# Patient Record
Sex: Male | Born: 1990
Health system: Southern US, Community
[De-identification: ages and names within clinical notes are randomized; demographics above are authoritative.]

## PROBLEM LIST (undated history)

## (undated) HISTORY — PX: APPENDECTOMY: SHX54

---
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
## 2010-07-15 LAB — URINALYSIS, ROUTINE W REFLEX MICROSCOPIC
Glucose, UA: NEGATIVE mg/dL
Hgb urine dipstick: NEGATIVE
Specific Gravity, Urine: 1.017 (ref 1.005–1.030)
pH: 7 (ref 5.0–8.0)

## 2010-08-21 NOTE — H&P (Signed)
NAME:  PARSA, RICKETT                 ACCOUNT NO.:  [id number]   MEDICAL RECORD NO.:  [id number]          PATIENT TYPE:  OBV   LOCATION:  0111                         FACILITY:  [facility]   PHYSICIAN:  Sandria Bales. Ezzard Standing, M.D.  DATE OF BIRTH:  09/10/1990   DATE OF ADMISSION:  04/14/2007  DATE OF DISCHARGE:                              HISTORY & PHYSICAL   HISTORY OF PRESENT ILLNESS:  This is a 20 year old black male who has no  identified primary medical physician, who at approximately 6:00 p.m.  last evening started developing abdominal pain.  He has vomited since  that abdominal pain began, he was brought to the [facility] emergency room by  his father this morning about 6:00 a.m.  The pain is localized mainly in  the right side of abdomen.  He has no prior history of peptic ulcer  disease, liver disease, colon disease.  He had no prior abdominal  surgery.   PAST MEDICAL HISTORY:  Allergies:  HAS NO ALLERGIES.   MEDICATIONS:  He is on no medications.   REVIEW OF SYSTEMS:  NEUROLOGIC:  No seizure or loss of consciousness.  PULMONARY:  He does not smoke cigarettes.  No history of lung disease.  CARDIAC:  No history of heart disease or chest pain.  GASTROINTESTINAL:  See History of Present Illness.  UROLOGIC:  No kidney stones or kidney infections.   SOCIAL HISTORY:  His father is with him in the emergency room.  He is a  tenth grade student at [employer].   PHYSICAL EXAM:  VITAL SIGNS:  His temperature 97.1, blood pressure  119/53, pulse 79, respirations 16.  GENERAL:  He is a well-nourished thin black male, alert, cooperative  with physical exam.  HEENT:  Unremarkable.  NECK:  Supple.  I feel no mass or thyromegaly.  LUNGS:  Symmetric breath sounds.  HEART:  Has a regular rate and rhythm without murmur or rub.  ABDOMEN:  Shows decreased bowel sounds.  He has some mild guarding and  tenderness in his right abdomen, with mild rebound.  He has no abdominal  mass.  No hernia.  EXTREMITIES:  Good strength in the upper and lower extremities.  NEUROLOGIC:  Grossly intact.   LABS:  I have, show a white blood count of 18,400, hemoglobin 13,  hematocrit 37.  His sodium is 136, potassium 3.6, chloride of 102, CO2  of 29, BUN of 9, creatinine of 1.1.  His urinalysis is negative.  CT  scan shows a distended appendix, approximately 12 mm, a  possible  appendolith consistent with appendicitis.   DIAGNOSIS:  Appendicitis.  I discussed with him and his father about  proceeding with appendix surgery today.  I discussed indications and  potential complications with the patient and his father.  I discussed  the laparoscopic approach to his surgery, potential complications of  bleeding and bowel injury,  and the length of hospitalization.      Sandria Bales. Ezzard Standing, M.D.  Electronically Signed     DHN/MEDQ  D:  04/14/2007  T:  04/14/2007  Job:  [id number]
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
## 2010-08-21 NOTE — Op Note (Signed)
NAME:  ARPAN, ESKELSON                 ACCOUNT NO.:  [id number]   MEDICAL RECORD NO.:  [id number]          PATIENT TYPE:  OBV   LOCATION:  1537                         [facility]ACILITY:  [facility]   PHYSICIAN:  Sandria Bales. Ezzard Standing, M.D.  DATE OF BIRTH:  1990/06/01   DATE OF PROCEDURE:  04/14/2007  DATE OF DISCHARGE:                               OPERATIVE REPORT   PREOPERATIVE DIAGNOSIS:  Acute appendicitis.   POSTOPERATIVE DIAGNOSIS:  Acute appendicitis.   PROCEDURE:  Laparoscopic appendectomy.   SURGEON:  Dr. Ovidio Kin.   FIRST ASSISTANT:  None.   ANESTHESIA:  General endotracheal.   ESTIMATED BLOOD LOSS:  Minimal.   INDEPENDENT MEDICAL EVALUATION:  Mr. Delamater is a 20 year old black male  who has no identified primary medical physician who has presented with  about an 18-hour history of abdominal pain localized to the right  abdomen.  He underwent a CT scan which was consistent with acute  appendicitis.   I discussed with him and his father about proceeding with appendectomy.  I discussed indications, potential complications.  Potential  complications include but not limited to bleeding, bowel injury,  possible open surgery.   OPERATIVE NOTE:  The patient placed in a supine position, given a  general endotracheal anesthetic.  He had a Foley catheter in place and  his abdomen was prepped and shaved.  He is on Zosyn as antibiotic and we  held time-out identifying the patient and the procedure.   I accessed his abdominal cavity with a 12 mm Hasson trocar and passed 0  degrees 10 mm laparoscope through a 12 mm Hasson trocar and secured this  Hasson trocar with a 0-0 Vicryl suture.   I placed a 5 mm trocar in the right upper quadrant, 11-mm trocar in the  left lower quadrant.  I then did abdominal exploration.   Abdominal exploration revealed right and left lobes of liver  unremarkable.  Stomach unremarkable.  Gallbladder unremarkable, bowel  that I could see was unremarkable.   In his  right lower quadrant was an inflammatory mass with omentum caked  around an acutely inflamed appendix.   I used the harmonic scalpel to take down the mesentery of the appendix.  I then used a vascular load of the Endo GIA 45-mm stapler across the  base of the appendix.  I then placed the appendix in EndoCatch bag and  delivered it through the umbilicus.  I reinspected the staple line at  the cecum and irrigated the abdomen with a liter of saline.  There was  no bleeding at the staple line.  The staples looked like they fired  well.  There was no other acute inflammation within the abdominal  cavity.   The patient tolerated the procedure well.  The appendix was sent to  pathology.  I then removed all my trocars under direct visualization.  There was no bleeding at any trocar site.  The umbilical port was closed  with 0-0 Vicryl suture.  I infiltrated about 20 mL of 0.25% Marcaine as  local anesthetic.  I closed the skin at each site with  a 5-0 Monocryl  suture, painted the wound with tincture of benzoin and steri-stripped  it.  Sponge and needle count were correct at end of the case.  The  sponge and needle count were correct at the end of the case.  The  patient tolerated procedure well, was transported to recovery room in  good condition.      Sandria Bales. Ezzard Standing, M.D.  Electronically Signed     DHN/MEDQ  D:  04/14/2007  T:  04/15/2007  Job:  [id number]
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
## 2010-12-27 LAB — BASIC METABOLIC PANEL
BUN: 9
Chloride: 102
Creatinine, Ser: 1.17
Potassium: 3.6

## 2010-12-27 LAB — CBC
HCT: 37.9
MCV: 80.8
Platelets: 246
RBC: 4.69
WBC: 18.4 — ABNORMAL HIGH

## 2010-12-27 LAB — URINALYSIS, ROUTINE W REFLEX MICROSCOPIC
Glucose, UA: NEGATIVE
Ketones, ur: NEGATIVE
Nitrite: NEGATIVE
Specific Gravity, Urine: 1.009
pH: 7.5

## 2010-12-27 LAB — DIFFERENTIAL
Lymphocytes Relative: 5 — ABNORMAL LOW
Lymphs Abs: 0.8 — ABNORMAL LOW
Monocytes Relative: 3
Neutrophils Relative %: 92 — ABNORMAL HIGH
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
## 2011-11-02 ENCOUNTER — Encounter (HOSPITAL_COMMUNITY): Payer: Self-pay | Admitting: *Deleted

## 2011-11-02 ENCOUNTER — Emergency Department (HOSPITAL_COMMUNITY)
Admission: EM | Admit: 2011-11-02 | Discharge: 2011-11-03 | Disposition: A | Discharge status: Home / Self Care | Payer: Self-pay | Attending: Emergency Medicine | Admitting: Emergency Medicine

## 2011-11-02 DIAGNOSIS — A6000 Herpesviral infection of urogenital system, unspecified: Secondary | ICD-10-CM

## 2011-11-02 DIAGNOSIS — F17200 Nicotine dependence, unspecified, uncomplicated: Secondary | ICD-10-CM | POA: Insufficient documentation

## 2011-11-02 DIAGNOSIS — R300 Dysuria: Secondary | ICD-10-CM | POA: Insufficient documentation

## 2011-11-02 DIAGNOSIS — A6001 Herpesviral infection of penis: Secondary | ICD-10-CM | POA: Insufficient documentation

## 2011-11-02 NOTE — ED Notes (Signed)
Pt c/o painful urination x 2 days. Pt states started seeing "bumps" on penis x 1 day ago. Pt denies discharged. Pt reports unprotected sex w/ new partner x 1 week ago.

## 2011-11-03 LAB — URINALYSIS, ROUTINE W REFLEX MICROSCOPIC
Glucose, UA: NEGATIVE mg/dL
Hgb urine dipstick: NEGATIVE
pH: 6 (ref 5.0–8.0)

## 2011-11-03 LAB — URINE MICROSCOPIC-ADD ON

## 2011-11-03 MED ORDER — VALACYCLOVIR HCL 1 G PO TABS: 1000.0000 mg | ORAL_TABLET | Freq: Two times a day (BID) | ORAL | Status: AC

## 2011-11-03 MED ORDER — VALACYCLOVIR HCL 500 MG PO TABS
1000.0000 mg | ORAL_TABLET | Freq: Every day | ORAL | Status: DC
  Filled 2011-11-03: qty 2

## 2011-11-03 MED ORDER — AZITHROMYCIN 250 MG PO TABS
1000.0000 mg | ORAL_TABLET | Freq: Once | ORAL | Status: AC
  Administered 2011-11-03: 1000 mg via ORAL
  Filled 2011-11-03: qty 4

## 2011-11-03 MED ORDER — CEFTRIAXONE SODIUM 250 MG IJ SOLR
250.0000 mg | Freq: Once | INTRAMUSCULAR | Status: AC
  Administered 2011-11-03: 250 mg via INTRAMUSCULAR
  Filled 2011-11-03: qty 250

## 2011-11-03 NOTE — ED Provider Notes (Signed)
History     CSN: [id number]  Arrival date & time 11/02/11  2220   First MD Initiated Contact with Patient 11/03/11 0041      Chief Complaint  Patient presents with  . Dysuria     HPI Patient presents to the emergency room with complaints of discomfort with urination for the last couple of days. Today he also noticed several sore bumps on the lateral aspect of his penis. He has not had any discharge. He has not had any fevers, abdominal pain, vomiting, or diarrhea. He did have unprotected sex recently with a new partner about a week ago. He has never had any STD type symptoms in the past. Symptoms are mild to moderate. History reviewed. No pertinent past medical history.  Past Surgical History  Procedure Date  . Appendectomy     History reviewed. No pertinent family history.  History  Substance Use Topics  . Smoking status: Current Everyday Smoker    Types: Cigarettes  . Smokeless tobacco: Not on file  . Alcohol Use: No      Review of Systems  All other systems reviewed and are negative.    Allergies  Review of patient's allergies indicates no known allergies.  Home Medications   Current Outpatient Rx  Name Route Sig Dispense Refill  . VALACYCLOVIR HCL 1 G PO TABS Oral Take 1 tablet (1,000 mg total) by mouth 2 (two) times daily. 14 tablet 0    BP 117/66  Pulse 89  Temp 98.7 F (37.1 C) (Oral)  Resp 16  SpO2 100%  Physical Exam  Nursing note and vitals reviewed. Constitutional: He appears well-developed and well-nourished. No distress.  HENT:  Head: Normocephalic and atraumatic.  Right Ear: External ear normal.  Left Ear: External ear normal.  Eyes: Conjunctivae are normal. Right eye exhibits no discharge. Left eye exhibits no discharge. No scleral icterus.  Neck: Neck supple. No tracheal deviation present.  Pulmonary/Chest: Effort normal. No stridor.  Abdominal: Soft. Bowel sounds are normal. He exhibits no distension. There is no tenderness. There is  no rebound and no guarding. Hernia confirmed negative in the right inguinal area and confirmed negative in the left inguinal area.  Genitourinary: Testes normal. Circumcised. Penile tenderness present. No discharge found.       A cluster of vesicular lesions are noted on the lateral aspect on the right side of the penis  Musculoskeletal: He exhibits no edema and no tenderness.  Neurological: He is alert. He has normal strength. No sensory deficit. Cranial nerve deficit:  no gross defecits noted. He exhibits normal muscle tone. He displays no seizure activity. Coordination normal.  Skin: Skin is warm and dry. No rash noted.  Psychiatric: He has a normal mood and affect.    ED Course  Procedures (including critical care time)   Labs Reviewed  URINALYSIS, ROUTINE W REFLEX MICROSCOPIC   No results found.   1. Genital HSV       MDM  Patient will be treated for STD. I have encouraged him to followup with a primary care doctor or the health Department if the symptoms persist. I instructed him to have his partners seek medical evaluation routinely and he should avoid intercourse until they're both evaluated.        Celene Kras, MD 11/03/11 0100

## 2014-02-20 ENCOUNTER — Encounter (HOSPITAL_COMMUNITY): Payer: Self-pay | Admitting: Emergency Medicine

## 2014-02-20 ENCOUNTER — Emergency Department (HOSPITAL_COMMUNITY)
Admission: EM | Admit: 2014-02-20 | Discharge: 2014-02-20 | Disposition: A | Discharge status: Home / Self Care | Payer: BC Managed Care – PPO | Attending: Emergency Medicine | Admitting: Emergency Medicine

## 2014-02-20 DIAGNOSIS — Z720 Tobacco use: Secondary | ICD-10-CM | POA: Diagnosis not present

## 2014-02-20 DIAGNOSIS — N342 Other urethritis: Secondary | ICD-10-CM | POA: Insufficient documentation

## 2014-02-20 DIAGNOSIS — Z202 Contact with and (suspected) exposure to infections with a predominantly sexual mode of transmission: Secondary | ICD-10-CM | POA: Diagnosis not present

## 2014-02-20 MED ORDER — CEFTRIAXONE SODIUM 250 MG IJ SOLR
250.0000 mg | Freq: Once | INTRAMUSCULAR | Status: AC
  Administered 2014-02-20: 250 mg via INTRAMUSCULAR
  Filled 2014-02-20: qty 250

## 2014-02-20 MED ORDER — LIDOCAINE HCL (PF) 1 % IJ SOLN
2.0000 mL | Freq: Once | INTRAMUSCULAR | Status: AC
  Administered 2014-02-20: 2 mL
  Filled 2014-02-20: qty 5

## 2014-02-20 MED ORDER — AZITHROMYCIN 250 MG PO TABS
1000.0000 mg | ORAL_TABLET | Freq: Once | ORAL | Status: AC
  Administered 2014-02-20: 1000 mg via ORAL
  Filled 2014-02-20: qty 4

## 2014-02-20 NOTE — ED Notes (Signed)
Patient with two to three days of penile drainage.  Patient having a burning sensation in his penis.  Patient had unprotected sex about one week ago.

## 2014-02-20 NOTE — Discharge Instructions (Signed)
You were not tested for all STDs today. Your gonorrhea and chlamydia tests are pending- if they are positive, you will receive a phone call. Refrain from sex until you have the results from a full STD screen. Please go to [facility] (Address: [address], [place], [place] [address] Phone: [phone]) or see the [facility] (Address: [address] Phone: [phone]) for full STD screening. Return to the emergency room for worsening of symptoms, fever, and vomiting. ° °Do not hesitate to return to the emergency room for any new, worsening or concerning symptoms. ° °Please obtain primary care using resource guide below. But the minute you were seen in the emergency room and that they will need to obtain records for further outpatient management. ° ° ° °Emergency Department Resource Guide °1) Find a Doctor and Pay Out of Pocket °Although you won't have to find out who is covered by your insurance plan, it is a good idea to ask around and get recommendations. You will then need to call the office and see if the doctor you have chosen will accept you as a new patient and what types of options they offer for patients who are self-pay. Some doctors offer discounts or will set up payment plans for their patients who do not have insurance, but you will need to ask so you aren't surprised when you get to your appointment. ° °2) Contact Your Local Health Department °Not all health departments have doctors that can see patients for sick visits, but many do, so it is worth a call to see if yours does. If you don't know where your local health department is, you can check in your phone book. The CDC also has a tool to help you locate your state's health department, and many state websites also have listings of all of their local health departments. ° °3) Find a Walk-in Clinic °If your illness is not likely to be very severe or complicated, you may want to try a walk in clinic. These are  popping up all over the country in pharmacies, drugstores, and shopping centers. They're usually staffed by nurse practitioners or physician assistants that have been trained to treat common illnesses and complaints. They're usually fairly quick and inexpensive. However, if you have serious medical issues or chronic medical problems, these are probably not your best option. ° °No Primary Care Doctor: °- Call [facility] at  832-8000 - they can help you locate a primary care doctor that  accepts your insurance, provides certain services, etc. °- Physician Referral Service- 1-800-533-3463 ° °Chronic Pain Problems: °Organization         Address  Phone   Notes  °[facility]  (336) 297-2271 Patients need to be referred by their primary care doctor.  ° °Medication Assistance: °Organization         Address  Phone   Notes  °[place] [facility] [address] °[place], [place] [address] (336) 641-8030 --Must be a resident of [place] °-- Must have NO insurance coverage whatsoever (no Medicaid/ Medicare, etc.) °-- The pt. MUST have a primary care doctor that directs their care regularly and follows them in the community °  °[facility]  (866) 331-1348   °[employer]  (888) 892-1162   ° °Agencies that provide inexpensive medical care: °Organization         Address  Phone   Notes  °[facility]  (336) 832-8035   °[facility]    (336) 832-7272   °[facility] [address] °[place], [place] [address] (336) 832-4777   °[facility] [address], °[place] (336) 271-4999   °[facility]    (336) 373-0678   °[facility]    (336) 272-1050   °[facility] ° [address], [place] Phone:  (336) 832-4444, Fax:  (336) 832-4440 Hours of Operation:  9 am - 6 pm, M-F.  Also accepts Medicaid/Medicare and self-pay.  °[facility] ° [address], [place] Phone: (336) 832-3150, Fax: (336) [phone]. Hours of Operation:  8:30 am - 5:30 pm, M-F.  Also accepts Medicaid and self-pay.  °[facility] [place] [address], [place] Phone: (336) 878-6027   °[facility] [address], [place], [place] (336)723-1848, Ext. 123 Mondays & Thursdays: 7-9 AM.  First 15 patients are seen on a first come, first serve basis. °  ° °Medicaid-accepting [place] Providers: ° °Organization         Address  Phone   Notes  °[facility] [address], [place] (336) 641-2100 Also accepts self-pay patients.  °[facility] [address], [place] ° (336) 856-9996   °[facility] [address], [place] (336) 288-8857   °[facility] [address] [place] [address], [place] (336) 299-7000   °Veita Bland [address], [place]  ° (336) 373-1557 Only accepts [place] Access Medicaid patients after they have their name applied to their card.  ° °Self-Pay (no insurance) in [place]: ° °Organization         Address  Phone   Notes  °Sickle Cell Patients, [facility] [address], [place] (336) 832-1970   °[facility] [address], [place] (336) 832-4400   °[facility] ° [address], [place] (336) 992-4800   °[facility]/Dr. Osei-Bonsu ° [address] [place] [address], [place] or [address], [place] (336) 841-8500 Phone number for both [place] and [place] locations is the same.  °[facility] [address], [place] (336) 299-0000   °[facility] [address] [place] [address], [employer] or [address] (336) 852-7530 °(336) 878-2260   °[facility] [address], [place] (336) 350-1642, phone; (336) 294-5005, fax Sees patients 1st and 3rd Saturday  of every month.  Must not qualify for public or private insurance (i.e. Medicaid, Medicare, [place] Health Choice, Veterans' Benefits) • Household income should be no more than 200% of the poverty level •The clinic cannot treat you if you are pregnant or think you are pregnant • Sexually transmitted diseases are not treated at the clinic.  ° ° °Dental Care: °Organization         Address  Phone  Notes  °[place] [facility] [address],  (336) 641-6152 Accepts children up to age 21 who are enrolled in Medicaid or [place] Health Choice; pregnant women with a Medicaid card; and children who have applied for Medicaid or [place] Health Choice, but were declined, whose parents can pay a reduced fee at time of service.  °[facility] [place]  [address], [place] (336) 641-7733 Accepts children up to age 21 who are enrolled in Medicaid or [place] Health Choice; pregnant women with a Medicaid card; and children who have applied for Medicaid or [place] Health Choice, but were declined, whose parents can pay a reduced fee at time of service.  °[facility] ° [address], [place] (336) 641-4533 Patients are seen by appointment only. Walk-ins are not accepted. [facility] will see patients 18 years of age and older. °Monday - Tuesday (8am-5pm) °Most Wednesdays (8:30-5pm) °$30 per visit, cash only  °[facility] ° [address], [place] (336) 641-4533 Patients are seen by appointment only. Walk-ins are not accepted. [facility] will see patients 18 years of age and older. °One Wednesday Evening (Monthly: Volunteer Based).  $30 per visit, cash only  °[facility]  (919) 537-3737 for adults; Children under age 4, call Graduate Pediatric Dentistry at (919) [phone]. Children aged 4-14, please call (919) [phone] to request a pediatric application. °  Dental services are provided in all areas of dental care including fillings, crowns and bridges, complete and partial dentures, implants, gum treatment, root canals, and extractions. Preventive care is also provided. Treatment is provided to both adults and children. °Patients are selected via a lottery and there is often a waiting list. °  °[facility] [address], °[place] ° (336) 763-8833 [email or web address] °  °[facility] [address], [place], [place] (336)723-1848, Ext. 123 Second and Fourth Thursday of each month, opens at 6:30 AM; Clinic ends at 9 AM.  Patients are seen on a first-come first-served basis, and a limited number are seen during each clinic.  ° °[facility] ° [address], [place], [place] (336) 723-7904   Eligibility Requirements °You must have lived in [place], [place], or [place] counties for at least the last three months. °  You cannot be eligible for state or federal sponsored healthcare insurance, including Veterans Administration, Medicaid, or Medicare. °  You generally cannot be eligible for healthcare insurance through your employer.  °  How to apply: °Eligibility screenings are held every Tuesday and Wednesday afternoon from 1:00 pm until 4:00 pm. You do not need an appointment for the interview!  °[facility] [address], [place], [place] 336-631-2330   °[facility]  336-342-8273   °[facility]  336-703-3100   °[facility]  336-570-6415   ° °Behavioral Health Resources in the Community: °Intensive Outpatient Programs °Organization         Address  Phone  Notes  °[facility] [address], [place], [place] 336-878-6098   °[facility] [address], [place], [place] 336-832-9800   °[facility] [address], , [place] ° 336-882-2125   °[facility] [address],  °,  1-800-853-5163 or 336-641-4981   °Substance Abuse Resources °Organization         Address  Phone  Notes  °[facility]  336-882-2125   °[facility]  336-784-9470   °[facility]  336-285-9073   °[facility]  336-845-3988   °Residential & Outpatient Substance Abuse Program  1-800-659-3381   °Psychological Services °Organization         Address  Phone  Notes  °[facility]  336- 832-9600   °[facility]  336- 378-7881   °  [facility] [address], [place] 1-800-853-5163 or 336-641-4981   ° °Mobile Crisis Teams °Organization         Address  Phone  Notes  °[facility], Mobile Crisis Care Unit  1-877-626-1772   °Assertive °[facility] ° [address] [place], [place] 336-834-9664   °Sharon DeEsch [address] °[place] 336-554-5454   ° °Self-Help/Support Groups °Organization         Address  Phone             Notes  °[facility] [place] - variety of support groups  336- 373-1402 Call for more information  °Narcotics Anonymous (NA), [facility] [address], °[place]  2 meetings at this location  ° °Residential Treatment Programs °Organization         Address  Phone  Notes  °[facility] [address],    °[place]  1-866-801-8205   °[facility] ° [address], [place], [place] 704-293-8524   °[facility] [address], [place] 336-845-3988 Admissions: 8am-3pm M-F  °[facility] [address]    °[place], [place] 336-841-1104   °[facility] [address], [place], [place] 336-379-7146   °[facility] [address]  °[place], [place] 336-285-9073   °[facility] - Intensive Outpatient [address], [place], [place] 336-852-3033   °[facility] [address] [place] [address]  °[place], [place]  1-877-615-2722 or 336-784-9470   °[facility] [address] [place], [place] 336-227-7417 Accepts Medicaid  °[facility] [address]  °[place] 1-800-659-3381 Substance Abuse/Addiction Treatment  ° °[place] Behavioral Health Resources °Organization         Address  Phone  Notes  °[facility]  (888) 581-9988   °Julie Brannon, PhD [address] [place], [place]   (336) 349-5553 or (336) 951-0000   °[facility]   [address] °[place], [place] (336) 349-4454   °[facility] [address], [place], [place] (336) 342-8316 Insurance/Medicaid/sponsorship through [facility]  °[facility] [address]                                    [place], [place] (336) 342-8316 Therapy/tele-psych/case  °[facility] [address]  ° [place], [place] (336) 349-2233    °Dr. Arfeen  (336) 349-4544   °[facility] [place]  [employer] [place] [facility] 1) [address], [place] °2) [address], [place] °3)  [address], [place] (336) 349-3220 °(336) 342-7768 ° °(336) 342-8140   °[place] [facility] (336) 342-1394 or (336) 342-3537 (After Hours)    ° ° ° °

## 2014-02-20 NOTE — ED Provider Notes (Signed)
CSN: [id number]     Arrival date & time 02/20/14  2056 History  This chart was scribed for non-physician practitioner, Wynetta EmeryNicole Manish Ruggiero, PA-C,working with Lyanne CoKevin M Campos, MD, by Karle PlumberJennifer Tensley, ED Scribe. This patient was seen in room TR06C/TR06C and the patient's care was started at 9:23 PM.   Chief Complaint  Patient presents with  . Exposure to STD   Patient is a 23 y.o. male presenting with STD exposure. The history is provided by the patient. No language interpreter was used.  Exposure to STD    HPI Comments:  Tim Noble is a 23 y.o. male who presents to the Emergency Department complaining of penile discharge and dysuria of the penis that started approximately one week ago. He reports having unprotected sex with his girlfriend of two years because they are trying to have a child. He denies fever, chills, nausea, vomiting, rash, abdominal pain, testicular pain or swelling, scrotal pain or swelling.   History reviewed. No pertinent past medical history. Past Surgical History  Procedure Laterality Date  . Appendectomy     No family history on file. History  Substance Use Topics  . Smoking status: Current Every Day Smoker    Types: Cigarettes  . Smokeless tobacco: Not on file  . Alcohol Use: No     Comment: socially    Review of Systems  Constitutional: Negative for fever and chills.  Gastrointestinal: Negative for nausea and vomiting.  Genitourinary: Positive for dysuria and discharge. Negative for penile swelling, scrotal swelling, genital sores, penile pain and testicular pain.  Skin: Negative for rash.  All other systems reviewed and are negative.   Allergies  Review of patient's allergies indicates no known allergies.  Home Medications   Prior to Admission medications   Not on File   Triage Vitals: BP 138/76 mmHg  Pulse 72  Temp(Src) 98.3 F (36.8 C) (Oral)  Resp 16  Ht 5\' 11"  (1.803 m)  Wt 151 lb (68.493 kg)  BMI 21.07 kg/m2  SpO2 100% Physical Exam   Constitutional: He is oriented to person, place, and time. He appears well-developed and well-nourished. No distress.  HENT:  Head: Normocephalic and atraumatic.  Eyes: Conjunctivae and EOM are normal.  Neck: Normal range of motion.  Cardiovascular: Normal rate.   Pulmonary/Chest: Effort normal. No stridor.  Genitourinary:  GU exam a chaperoned by scribed Victorino DikeJennifer: No rashes or lesions, no testicular pain or swelling, there is scant white urethral  discharge  Musculoskeletal: Normal range of motion.  Neurological: He is alert and oriented to person, place, and time.  Skin: Skin is warm and dry.  Psychiatric: He has a normal mood and affect. His behavior is normal.  Nursing note and vitals reviewed.   ED Course  Procedures (including critical care time) DIAGNOSTIC STUDIES: Oxygen Saturation is 100% on RA, normal by my interpretation.   COORDINATION OF CARE: 9:26 PM- Will check and treat for GC/Chlamydia. Offered blood testing for other STDs but pt declined. Will provide resource guide for pt to establish care with a PCP. Pt verbalizes understanding and agrees to plan.  Medications  cefTRIAXone (ROCEPHIN) injection 250 mg (not administered)  azithromycin (ZITHROMAX) tablet 1,000 mg (not administered)  lidocaine (PF) (XYLOCAINE) 1 % injection 2 mL (not administered)    Labs Review Labs Reviewed - No data to display  Imaging Review No results found.   EKG Interpretation None      MDM   Final diagnoses:  Possible exposure to STD  Urethritis  Filed Vitals:   02/20/14 2112  BP: 138/76  Pulse: 72  Temp: 98.3 F (36.8 C)  TempSrc: Oral  Resp: 16  Height: 5\' 11"  (1.803 m)  Weight: 151 lb (68.493 kg)  SpO2: 100%    Medications  cefTRIAXone (ROCEPHIN) injection 250 mg (not administered)  azithromycin (ZITHROMAX) tablet 1,000 mg (not administered)  lidocaine (PF) (XYLOCAINE) 1 % injection 2 mL (not administered)    Tim Noble is a 23 y.o. male presenting  with Urethral discharge and dysuria. No rashes or lesions. GC chlamydia swab sent for testing. Patient has opted for treatment at this time. I have offered full STD testing which patient declines.  Evaluation does not show pathology that would require ongoing emergent intervention or inpatient treatment. Pt is hemodynamically stable and mentating appropriately. Discussed findings and plan with patient/guardian, who agrees with care plan. All questions answered. Return precautions discussed and outpatient follow up given.    I personally performed the services described in this documentation, which was scribed in my presence. The recorded information has been reviewed and is accurate.    Wynetta Emeryicole Juliona Vales, PA-C 02/20/14 2148  Lyanne CoKevin M Campos, MD 02/23/14 [phone]

## 2014-02-21 LAB — GC/CHLAMYDIA PROBE AMP
CT Probe RNA: NEGATIVE
GC Probe RNA: POSITIVE — AB

## 2014-02-22 ENCOUNTER — Telehealth (HOSPITAL_BASED_OUTPATIENT_CLINIC_OR_DEPARTMENT_OTHER): Payer: Self-pay | Admitting: Emergency Medicine

## 2014-02-22 NOTE — Telephone Encounter (Signed)
Patient resulted + gonorrhea, treated accordingly with rocephin and zithromax

## 2014-02-23 ENCOUNTER — Telehealth (HOSPITAL_BASED_OUTPATIENT_CLINIC_OR_DEPARTMENT_OTHER): Payer: Self-pay | Admitting: Emergency Medicine
